# Patient Record
Sex: Female | Born: 1965 | Race: Black or African American | Hispanic: No | Marital: Single | Smoking: Never smoker
Health system: Southern US, Community
[De-identification: ages and names within clinical notes are randomized; demographics above are authoritative.]

## PROBLEM LIST (undated history)

## (undated) HISTORY — PX: ABDOMINAL HYSTERECTOMY: SHX81

---

## 2021-02-09 ENCOUNTER — Emergency Department: Payer: No Typology Code available for payment source

## 2021-02-09 ENCOUNTER — Emergency Department
Admission: EM | Admit: 2021-02-09 | Discharge: 2021-02-09 | Disposition: A | Discharge status: Home / Self Care | Payer: No Typology Code available for payment source | Attending: Emergency Medicine | Admitting: Emergency Medicine

## 2021-02-09 ENCOUNTER — Other Ambulatory Visit: Payer: Self-pay

## 2021-02-09 DIAGNOSIS — M5126 Other intervertebral disc displacement, lumbar region: Secondary | ICD-10-CM | POA: Insufficient documentation

## 2021-02-09 DIAGNOSIS — N3001 Acute cystitis with hematuria: Secondary | ICD-10-CM | POA: Insufficient documentation

## 2021-02-09 DIAGNOSIS — M549 Dorsalgia, unspecified: Secondary | ICD-10-CM | POA: Diagnosis present

## 2021-02-09 DIAGNOSIS — M5136 Other intervertebral disc degeneration, lumbar region: Secondary | ICD-10-CM

## 2021-02-09 LAB — URINALYSIS, COMPLETE (UACMP) WITH MICROSCOPIC
Bilirubin Urine: NEGATIVE
Glucose, UA: NEGATIVE mg/dL
Ketones, ur: NEGATIVE mg/dL
Leukocytes,Ua: NEGATIVE
Nitrite: NEGATIVE
Protein, ur: NEGATIVE mg/dL
Specific Gravity, Urine: 1.021 (ref 1.005–1.030)
pH: 5 (ref 5.0–8.0)

## 2021-02-09 LAB — PREGNANCY, URINE: Preg Test, Ur: NEGATIVE

## 2021-02-09 MED ORDER — PREDNISONE 10 MG PO TABS: ORAL_TABLET | ORAL | 0 refills | Status: AC

## 2021-02-09 MED ORDER — METHOCARBAMOL 750 MG PO TABS: 750.0000 mg | ORAL_TABLET | Freq: Four times a day (QID) | ORAL | 0 refills | Status: AC | PRN

## 2021-02-09 MED ORDER — HYDROCODONE-ACETAMINOPHEN 5-325 MG PO TABS
1.0000 | ORAL_TABLET | Freq: Once | ORAL | Status: AC
Start: 2021-02-09 — End: 2021-02-09
  Administered 2021-02-09: 1 via ORAL
  Filled 2021-02-09: qty 1

## 2021-02-09 MED ORDER — ACETAMINOPHEN 325 MG PO TABS
650.0000 mg | ORAL_TABLET | Freq: Once | ORAL | Status: AC
  Administered 2021-02-09: 650 mg via ORAL
  Filled 2021-02-09: qty 2

## 2021-02-09 MED ORDER — DEXAMETHASONE SODIUM PHOSPHATE 10 MG/ML IJ SOLN
10.0000 mg | Freq: Once | INTRAMUSCULAR | Status: AC
  Administered 2021-02-09: 10 mg via INTRAMUSCULAR
  Filled 2021-02-09: qty 1

## 2021-02-09 MED ORDER — CEPHALEXIN 500 MG PO CAPS
1000.0000 mg | ORAL_CAPSULE | Freq: Once | ORAL | Status: AC
  Administered 2021-02-09: 1000 mg via ORAL
  Filled 2021-02-09: qty 2

## 2021-02-09 MED ORDER — METHOCARBAMOL 500 MG PO TABS
750.0000 mg | ORAL_TABLET | Freq: Once | ORAL | Status: AC
  Administered 2021-02-09: 750 mg via ORAL
  Filled 2021-02-09: qty 2

## 2021-02-09 MED ORDER — CEPHALEXIN 500 MG PO CAPS: 500.0000 mg | ORAL_CAPSULE | Freq: Four times a day (QID) | ORAL | 0 refills | Status: AC

## 2021-02-09 NOTE — ED Triage Notes (Signed)
Reports hx of lower back pain over last year, worse on awakening this AM. Pt states pain with weight wearing and walking. No injury. Pt alert and oriented X4, cooperative, RR even and unlabored, color WNL. Pt in NAD.

## 2021-02-09 NOTE — ED Notes (Signed)
Pt taken to flex 51 via wheelchair, placed in bed by NT. NAD.

## 2021-02-09 NOTE — Discharge Instructions (Addendum)
You have been prescribed an antibiotic for a potential urinary tract infection.  Please take the entire antibiotic and do not stop early.  You have also been prescribed a steroid taper, please take this as prescribed.  You have also been prescribed a muscle relaxer to use up to 4 times daily as needed for pain.  Please do not drive or operate heavy machinery on this medication.  You may also combine these medications with Tylenol, 1000 mg up to 4 times daily as needed for pain.  Please follow-up with neurosurgery if pain persist.  Please return to the emergency department with any worsening, or establish with primary care otherwise for follow-up.

## 2021-02-09 NOTE — ED Provider Notes (Signed)
[facility] Emergency Department Provider Note  ____________________________________________   Event Date/Time   First MD Initiated Contact with Patient 02/09/21 1018     (approximate)  I have reviewed the triage vital signs and the nursing notes.   HISTORY  Chief Complaint Back Pain  HPI Teresa Shelton is a 55 y.o. female reports to the ER for evaluation of back pain that started today.  Patient states intermittent problems with it over the last year, much worse today.  She reports if she sits or lays down, there is a significant amount of pressure in the back, she has initially alleviation of symptoms with standing and weightbearing but then the pain worsens again.  She denies any trauma or injury.  Denies fever, loss of bowel or bladder control.  She denies weakness down either extremity.  She denies any urinary symptoms or flank pain.  No alleviating measures were attempted prior to arrival.       History reviewed. No pertinent past medical history.  There are no problems to display for this patient.   History reviewed. No pertinent surgical history.  Prior to Admission medications   Medication Sig Start Date End Date Taking? Authorizing Provider  cephALEXin (KEFLEX) 500 MG capsule Take 1 capsule (500 mg total) by mouth 4 (four) times daily for 7 days. 02/09/21 02/16/21 Yes Ellarie Picking, Ruben Gottronaitlin J, PA  methocarbamol (ROBAXIN-750) 750 MG tablet Take 1 tablet (750 mg total) by mouth 4 (four) times daily as needed for up to 10 days for muscle spasms. 02/09/21 02/19/21 Yes Myiesha Edgar, Ruben Gottronaitlin J, PA  predniSONE (DELTASONE) 10 MG tablet Take 6 tablets (60 mg total) by mouth daily for 1 day, THEN 5 tablets (50 mg total) daily for 1 day, THEN 4 tablets (40 mg total) daily for 1 day, THEN 3 tablets (30 mg total) daily for 1 day, THEN 2 tablets (20 mg total) daily for 1 day, THEN 1 tablet (10 mg total) daily for 1 day. 02/09/21 02/15/21 Yes Lucy Chrisodgers, Tricha Ruggirello J, PA     Allergies Patient has no known allergies.  No family history on file.  Social History Social History   Tobacco Use  . Smoking status: Never Smoker  Substance Use Topics  . Alcohol use: Not Currently    Review of Systems Constitutional: No fever/chills Eyes: No visual changes. ENT: No sore throat. Cardiovascular: Denies chest pain. Respiratory: Denies shortness of breath. Gastrointestinal: No abdominal pain.  No nausea, no vomiting.  No diarrhea.  No constipation. Genitourinary: Negative for dysuria. Musculoskeletal: + for back pain. Skin: Negative for rash. Neurological: Negative for headaches, focal weakness or numbness.  ____________________________________________   PHYSICAL EXAM:  VITAL SIGNS: ED Triage Vitals  Enc Vitals Group     BP 02/09/21 0921 135/67     Pulse Rate 02/09/21 0921 95     Resp 02/09/21 0921 14     Temp 02/09/21 0921 98 F (36.7 C)     Temp Source 02/09/21 0921 Oral     SpO2 02/09/21 0921 96 %     Weight 02/09/21 0919 240 lb (108.9 kg)     Height 02/09/21 0919 5\' 6"  (1.676 m)     Head Circumference --      Peak Flow --      Pain Score 02/09/21 0919 8     Pain Loc --      Pain Edu? --      Excl. in [place]? --    Constitutional: Alert and oriented. Well appearing and  in no acute distress. Eyes: Conjunctivae are normal. PERRL. EOMI. Head: Atraumatic. Nose: No congestion/rhinnorhea. Mouth/Throat: Mucous membranes are moist.  Oropharynx non-erythematous. Neck: No stridor.   Cardiovascular: Normal rate, regular rhythm. Grossly normal heart sounds.  Good peripheral circulation. Respiratory: Normal respiratory effort.  No retractions. Lungs CTAB. Gastrointestinal: Soft and nontender. No distention. No abdominal bruits.  No CVA tenderness. Musculoskeletal: There is tenderness over the midline of the lumbar spine, minimal paraspinal tenderness also present.  5/5 strength in bilateral lower extremities in ankle plantarflexion, dorsiflexion, knee  flexion extension, hip flexion.  Negative straight leg raise bilaterally. Neurologic:  Normal speech and language. No gross focal neurologic deficits are appreciated. No gait instability. Skin:  Skin is warm, dry and intact. No rash noted. Psychiatric: Mood and affect are normal. Speech and behavior are normal.  ____________________________________________   LABS (all labs ordered are listed, but only abnormal results are displayed)  Labs Reviewed  URINALYSIS, COMPLETE (UACMP) WITH MICROSCOPIC - Abnormal; Notable for the following components:      Result Value   Color, Urine YELLOW (*)    APPearance HAZY (*)    Hgb urine dipstick SMALL (*)    Bacteria, UA RARE (*)    All other components within normal limits  URINE CULTURE  PREGNANCY, URINE   ____________________________________________  RADIOLOGY I, Lucy Chris, personally viewed and evaluated these images (plain radiographs) as part of my medical decision making, as well as reviewing the written report by the radiologist.  ED provider interpretation: X-rays of the lumbar spine demonstrate disc space narrowing in the L5-S1 region, but no acute fracture identified, see radiology report for CT renal stone findings  Official radiology report(s): DG Lumbar Spine 2-3 Views  Result Date: 02/09/2021 CLINICAL DATA:  Low back pain EXAM: LUMBAR SPINE - 2-3 VIEW COMPARISON:  None. FINDINGS: Frontal, lateral, and spot lumbosacral lateral images were obtained. There are 5 non-rib-bearing lumbar type vertebral bodies. There is no fracture or spondylolisthesis. There is moderate disc space narrowing at L5-S1. Other disc spaces appear unremarkable. No erosive change. IMPRESSION: Disc space narrowing at L5-S1. Other disc spaces appear unremarkable. No fracture or spondylolisthesis. Electronically Signed   By: Bretta Bang III M.D.   On: 02/09/2021 11:12   CT Renal Stone Study  Result Date: 02/09/2021 CLINICAL DATA:  Pain and hematuria  EXAM: CT ABDOMEN AND PELVIS WITHOUT CONTRAST TECHNIQUE: Multidetector CT imaging of the abdomen and pelvis was performed following the standard protocol without oral or IV contrast. COMPARISON:  None. FINDINGS: Lower chest: Lung bases clear. Hepatobiliary: A small portion of the dome of the liver is not imaged. Visualized liver appears unremarkable on this noncontrast enhanced study without focal evident lesion gallbladder wall is not appreciably thickened. There is no biliary duct dilatation. Pancreas: There is no pancreatic mass or inflammatory focus. Spleen: No splenic lesions are evident. Adrenals/Urinary Tract: Right adrenal appears normal. There is an apparent adenoma in the left adrenal measuring 1.5 x 1.1 cm. There is no appreciable renal mass or hydronephrosis on either side. There is no renal or ureteral calculus on either side. Note that there is a 2 mm calcification immediately adjacent to the distal right ureter but appearing separate from the right ureter, best evident on coronal images. Urinary bladder is midline. Urinary bladder wall borderline thickened. Stomach/Bowel: There are scattered colonic diverticula throughout the colon without diverticulitis. No appreciable bowel wall or mesenteric thickening. The terminal ileum appears normal. Appendix appears unremarkable. No appreciable free air or portal venous air.  Vascular/Lymphatic: No abdominal aortic aneurysm. No vascular lesions are evident on this noncontrast enhanced study. There is a lymph node in the left inguinal region measuring 1.4 x 1.1 cm. There are several nearby subcentimeter inguinal lymph nodes. There are lymph nodes in the pelvis which are toward upper limits of normal including a lymph node in the left common femoral node chain with a short axis diameter of 9 mm, upper normal. Several subcentimeter mesenteric lymph nodes also noted. Reproductive: Uterus absent.  No adnexal masses are evident. Other: No abscess or ascites evident in  the abdomen or pelvis. There is slight fat in the umbilicus. Musculoskeletal: There is degenerative change in the lower lumbar spine. There is a degree of spinal stenosis at L4-5 due to bony hypertrophy and disc protrusion. No blastic or lytic bone lesions. No intramuscular lesions evident. IMPRESSION: 1. Slight thickening of the urinary bladder wall. Advise urinalysis to assess for potential infection/cystitis. 2.  No renal or ureteral calculi.  No hydronephrosis on either side. 3. Scattered colonic diverticula. No diverticulitis. No bowel wall thickening or bowel obstruction. No abscess in the abdomen or pelvis. Appendix appears normal. 4. Slightly prominent left inguinal lymph node of uncertain etiology. Several upper normal in size pelvic lymph nodes and inguinal lymph nodes of uncertain etiology. Question reactive type etiology. 5.  Benign left adrenal adenoma measuring 1.5 x 1.1 cm. 6. Spinal stenosis at L4-5 due to disc protrusion and bony hypertrophy. 7.  Uterus absent. Electronically Signed   By: Bretta Bang III M.D.   On: 02/09/2021 12:24    ____________________________________________   INITIAL IMPRESSION / ASSESSMENT AND PLAN / ED COURSE  As part of my medical decision making, I reviewed the following data within the electronic MEDICAL RECORD NUMBER Nursing notes reviewed and incorporated, Labs reviewed, Radiograph reviewed, Notes from prior ED visits and [place] Controlled Substance Database        Patient is a 55 year old female who presents to the emergency department for evaluation of acute on chronic low back pain that began today.  She denies any fever, loss of bowel or bladder control or weakness on the extremities, denies dysuria or flank pain.  See HPI for further details.  In triage, patient is afebrile, normotensive and has signs.  Physical exam, patient does have significant tenderness to the midline of the lumbar spine with minimal paraspinal tenderness.  She does appear to be in  quite a bit of pain.  Urinalysis was obtained and demonstrates small amount of hemoglobin and rare bacteria, but no leukocytes or nitrates.  X-ray was obtained of the lumbar spine and demonstrates some disc space narrowing at L5-S1.  Given the patient's hematuria as well as significance of her back pain, CT renal stone study was obtained.  While this is negative for any acute stone, there is is better visualization of the lumbar spine with disc protrusion at L5-S1.  There is also bladder wall thickening that radiology recommended to be correlated with urinalysis.  While she does have rare bacteria, no leukocytes or nitrates, will culture the urine and initiate course of antibiotics for this.  In the interim, we will also initiate a course of steroids with muscle relaxant for musculoskeletal related back pain.  Recommend the patient have close follow-up with primary care after trial of these medications.  She is amenable with plan, stable this time for outpatient follow-up.      ____________________________________________   FINAL CLINICAL IMPRESSION(S) / ED DIAGNOSES  Final diagnoses:  Bulging lumbar disc  Acute cystitis with hematuria     ED Discharge Orders         Ordered    cephALEXin (KEFLEX) 500 MG capsule  4 times daily        02/09/21 1257    predniSONE (DELTASONE) 10 MG tablet        02/09/21 1257    methocarbamol (ROBAXIN-750) 750 MG tablet  4 times daily PRN        02/09/21 1257          *Please note:  Nicha Hemann was evaluated in Emergency Department on 02/10/2021 for the symptoms described in the history of present illness. She was evaluated in the context of the global COVID-19 pandemic, which necessitated consideration that the patient might be at risk for infection with the SARS-CoV-2 virus that causes COVID-19. Institutional protocols and algorithms that pertain to the evaluation of patients at risk for COVID-19 are in a state of rapid change based on information released  by regulatory bodies including the CDC and federal and state organizations. These policies and algorithms were followed during the patient's care in the ED.  Some ED evaluations and interventions may be delayed as a result of limited staffing during and the pandemic.*   Note:  This document was prepared using Dragon voice recognition software and may include unintentional dictation errors.   Lucy Chris, PA 02/10/21 8850    Concha Se, MD 02/10/21 0830

## 2021-02-09 NOTE — ED Notes (Signed)
D/c instructions reviewed with pt. Med hold until 1330.

## 2021-02-10 LAB — URINE CULTURE

## 2022-04-16 IMAGING — CT CT RENAL STONE PROTOCOL
2 of 4 series · 16 of 46 positions shown, 18 images · non-contrast
Comparison: None.

CLINICAL DATA: Pain and hematuria

EXAM:
CT ABDOMEN AND PELVIS WITHOUT CONTRAST
TECHNIQUE: Multidetector CT imaging of the abdomen and pelvis was performed
following the standard protocol without oral or IV contrast.

[Series 2: stone full standard · axial · 0.84mm/px · z∈[-491,-91]mm · 13 of 88 slices shown, 15 images]
[im 4/88  soft-tissue]
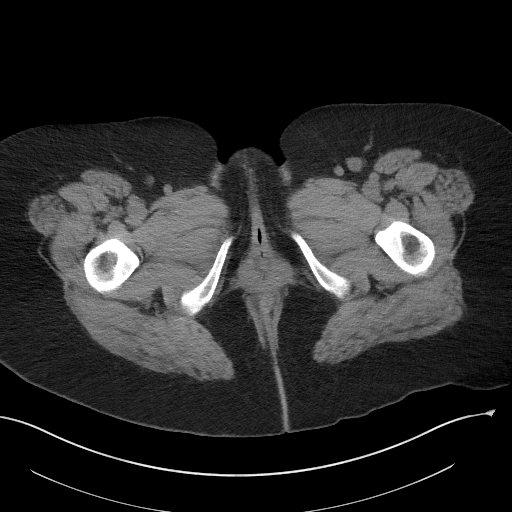
[im 4/88  bone]
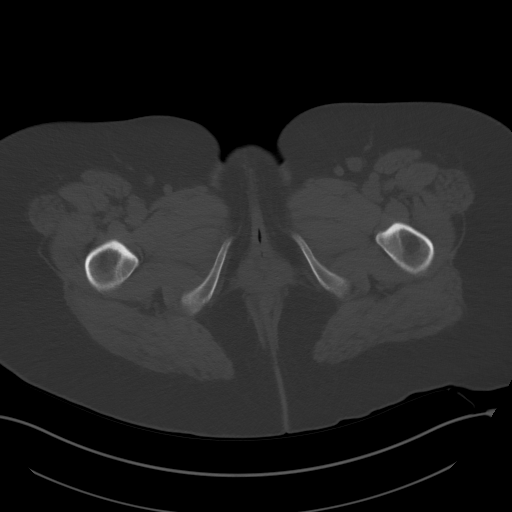
[im 12/88  soft-tissue]
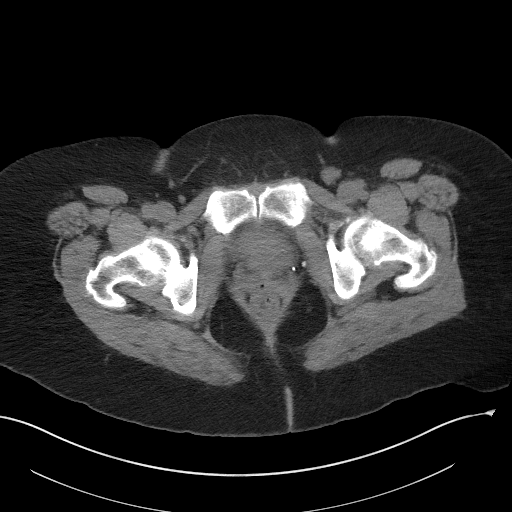
[im 19/88  soft-tissue]
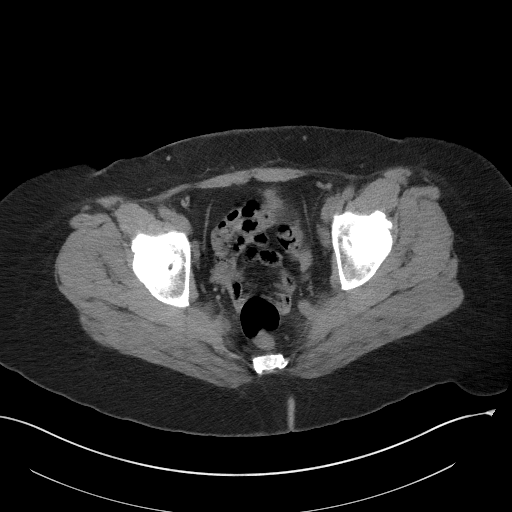
[im 23/88  soft-tissue]
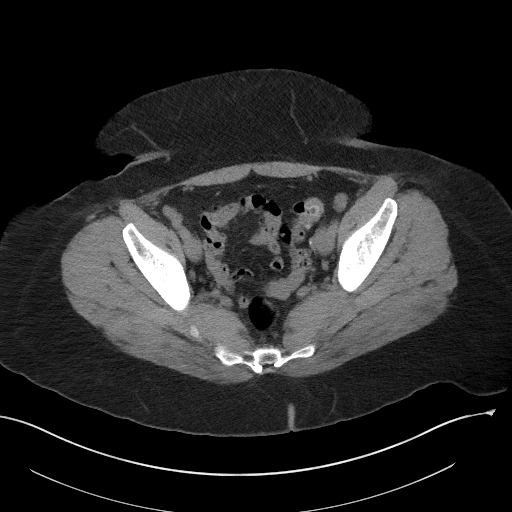
[im 31/88  soft-tissue]
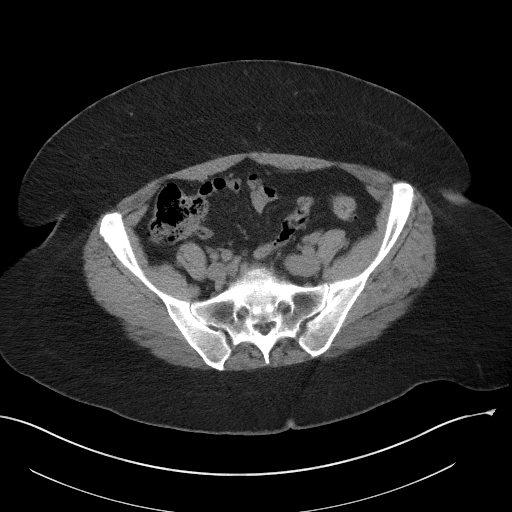
[im 38/88  soft-tissue]
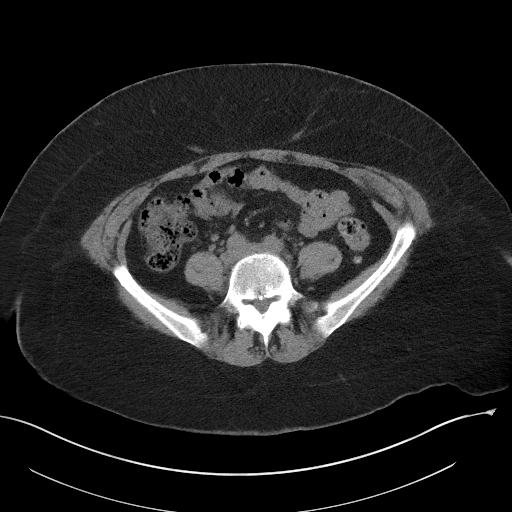
[im 46/88  soft-tissue]
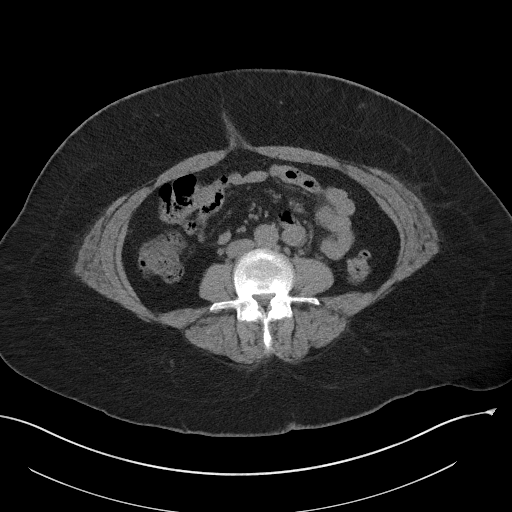
[im 50/88  soft-tissue]
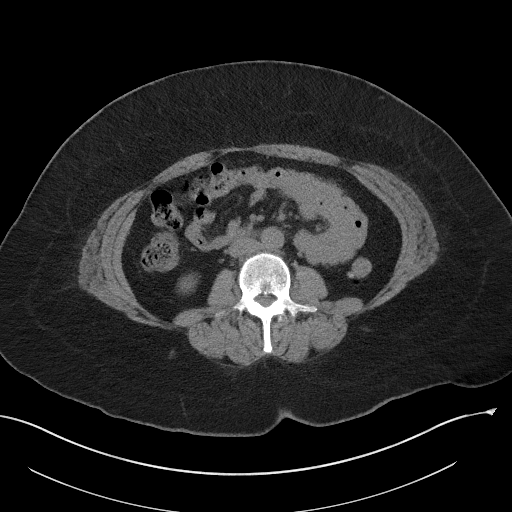
[im 57/88  soft-tissue]
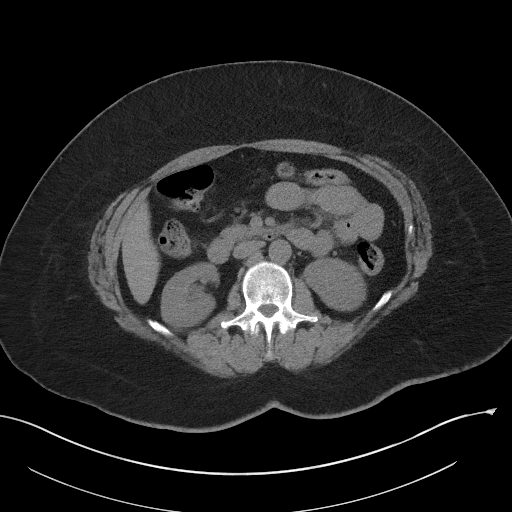
[im 57/88  bone]
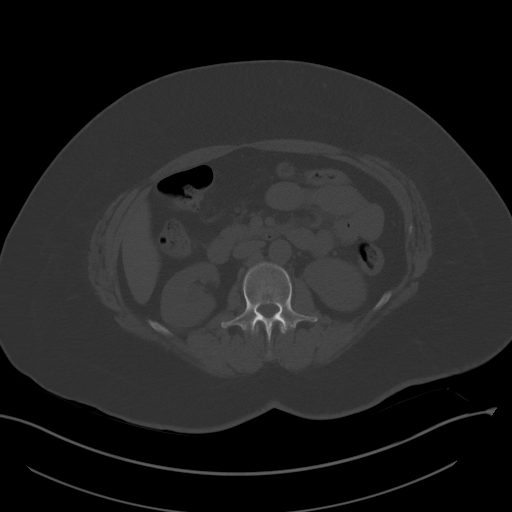
[im 65/88  soft-tissue]
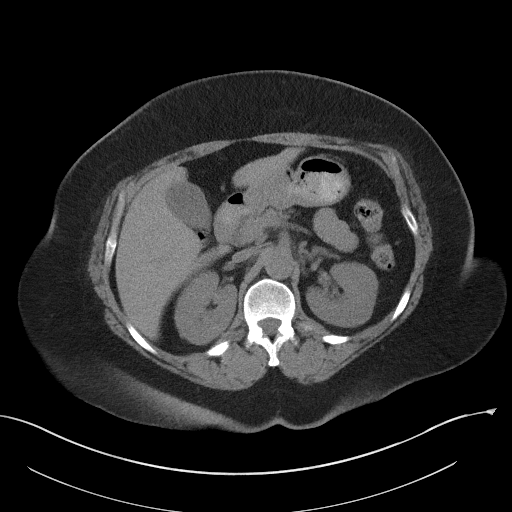
[im 69/88  soft-tissue]
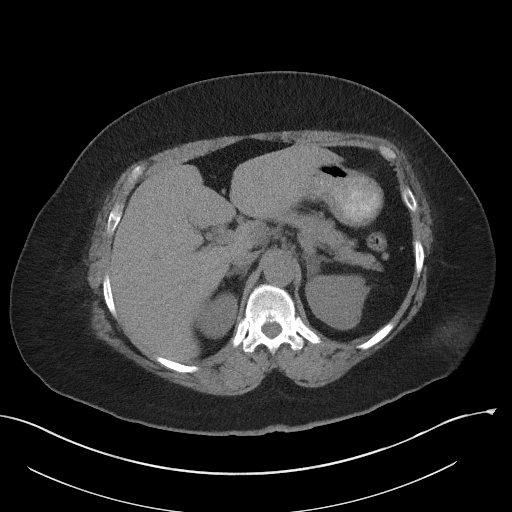
[im 76/88  soft-tissue]
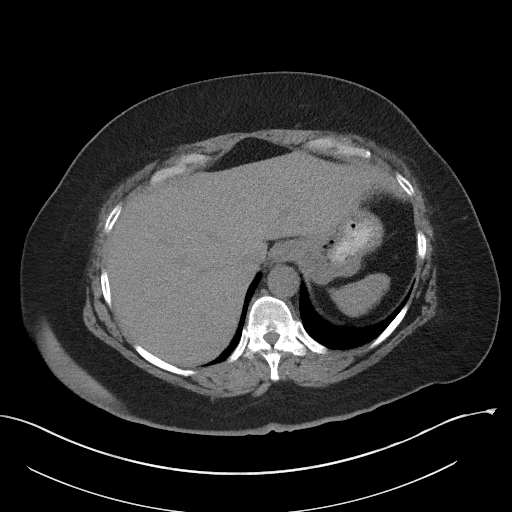
[im 84/88  soft-tissue]
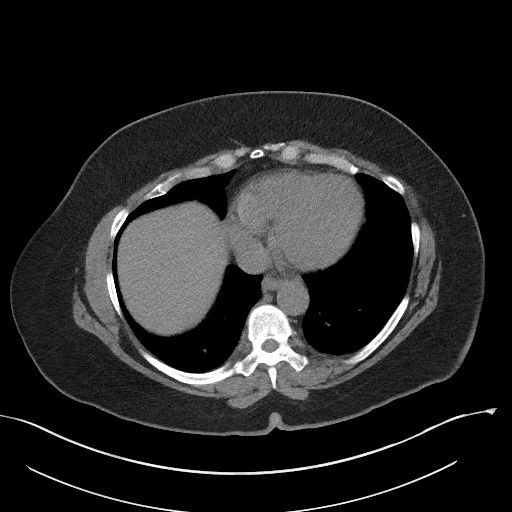

[Series 5: coronal · coronal · 0.88mm/px · 3 of 151 slices shown]
[im 51/151  soft-tissue]
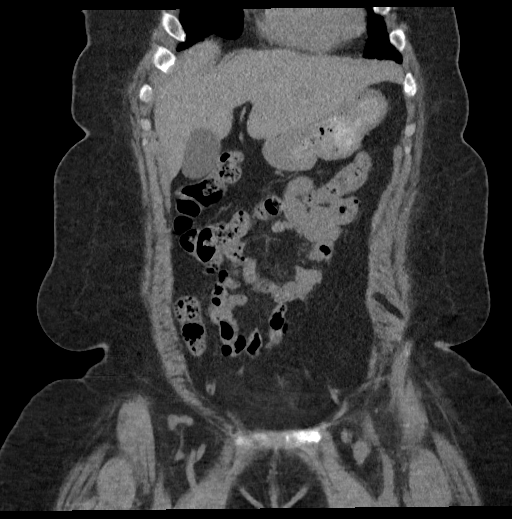
[im 67/151  soft-tissue]
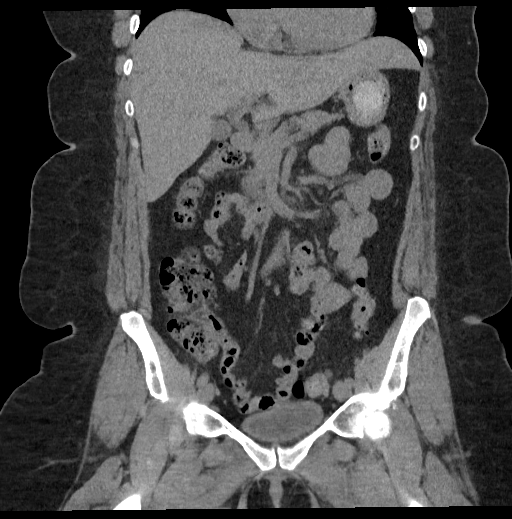
[im 84/151  soft-tissue]
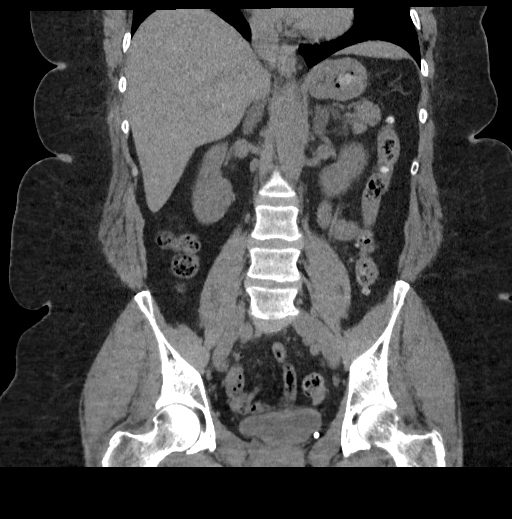

[16 of 46 positions shown; findings below may reference images not displayed]

FINDINGS: Lower chest: Lung bases clear.

Hepatobiliary: A small portion of the dome of the liver is not
imaged. Visualized liver appears unremarkable on this noncontrast
enhanced study without focal evident lesion gallbladder wall is not
appreciably thickened. There is no biliary duct dilatation.

Pancreas: There is no pancreatic mass or inflammatory focus.

Spleen: No splenic lesions are evident.

Adrenals/Urinary Tract: Right adrenal appears normal. There is an
apparent adenoma in the left adrenal measuring 1.5 x 1.1 cm. There
is no appreciable renal mass or hydronephrosis on either side. There
is no renal or ureteral calculus on either side. Note that there is
a 2 mm calcification immediately adjacent to the distal right ureter
but appearing separate from the right ureter, best evident on
coronal images. Urinary bladder is midline. Urinary bladder wall
borderline thickened.

Stomach/Bowel: There are scattered colonic diverticula throughout
the colon without diverticulitis. No appreciable bowel wall or
mesenteric thickening. The terminal ileum appears normal. Appendix
appears unremarkable. No appreciable free air or portal venous air.

Vascular/Lymphatic: No abdominal aortic aneurysm. No vascular
lesions are evident on this noncontrast enhanced study. There is a
lymph node in the left inguinal region measuring 1.4 x 1.1 cm. There
are several nearby subcentimeter inguinal lymph nodes. There are
lymph nodes in the pelvis which are toward upper limits of normal
including a lymph node in the left common femoral node chain with a
short axis diameter of 9 mm, upper normal. Several subcentimeter
mesenteric lymph nodes also noted.

Reproductive: Uterus absent.  No adnexal masses are evident.

Other: No abscess or ascites evident in the abdomen or pelvis. There
is slight fat in the umbilicus.

Musculoskeletal: There is degenerative change in the lower lumbar
spine. There is a degree of spinal stenosis at L4-5 due to bony
hypertrophy and disc protrusion. No blastic or lytic bone lesions.
No intramuscular lesions evident.
IMPRESSION: 1. Slight thickening of the urinary bladder wall. Advise urinalysis
to assess for potential infection/cystitis.

2.  No renal or ureteral calculi.  No hydronephrosis on either side.

3. Scattered colonic diverticula. No diverticulitis. No bowel wall
thickening or bowel obstruction. No abscess in the abdomen or
pelvis. Appendix appears normal.

4. Slightly prominent left inguinal lymph node of uncertain
etiology. Several upper normal in size pelvic lymph nodes and
inguinal lymph nodes of uncertain etiology. Question reactive type
etiology.

5.  Benign left adrenal adenoma measuring 1.5 x 1.1 cm.

6. Spinal stenosis at L4-5 due to disc protrusion and bony
hypertrophy.

7.  Uterus absent.

## 2023-01-31 ENCOUNTER — Ambulatory Visit
Admission: RE | Admit: 2023-01-31 | Discharge: 2023-01-31 | Disposition: A | Discharge status: Home / Self Care | Payer: No Typology Code available for payment source | Source: Ambulatory Visit

## 2023-01-31 DIAGNOSIS — K036 Deposits [accretions] on teeth: Secondary | ICD-10-CM | POA: Insufficient documentation

## 2023-01-31 DIAGNOSIS — K05311 Chronic periodontitis, localized, slight: Secondary | ICD-10-CM | POA: Insufficient documentation

## 2023-01-31 DIAGNOSIS — M25549 Pain in joints of unspecified hand: Secondary | ICD-10-CM | POA: Insufficient documentation

## 2023-01-31 DIAGNOSIS — E669 Obesity, unspecified: Secondary | ICD-10-CM | POA: Insufficient documentation

## 2023-01-31 DIAGNOSIS — Z6841 Body Mass Index (BMI) 40.0 and over, adult: Secondary | ICD-10-CM | POA: Insufficient documentation

## 2023-01-31 DIAGNOSIS — R059 Cough, unspecified: Secondary | ICD-10-CM | POA: Insufficient documentation

## 2023-01-31 DIAGNOSIS — E559 Vitamin D deficiency, unspecified: Secondary | ICD-10-CM | POA: Insufficient documentation

## 2023-01-31 DIAGNOSIS — R030 Elevated blood-pressure reading, without diagnosis of hypertension: Secondary | ICD-10-CM | POA: Insufficient documentation

## 2023-01-31 DIAGNOSIS — M722 Plantar fascial fibromatosis: Secondary | ICD-10-CM | POA: Insufficient documentation

## 2023-01-31 DIAGNOSIS — F411 Generalized anxiety disorder: Secondary | ICD-10-CM | POA: Insufficient documentation

## 2023-01-31 DIAGNOSIS — Z23 Encounter for immunization: Secondary | ICD-10-CM | POA: Insufficient documentation

## 2023-01-31 DIAGNOSIS — M545 Low back pain, unspecified: Secondary | ICD-10-CM

## 2023-01-31 DIAGNOSIS — Z655 Exposure to disaster, war and other hostilities: Secondary | ICD-10-CM | POA: Insufficient documentation

## 2023-01-31 DIAGNOSIS — F17200 Nicotine dependence, unspecified, uncomplicated: Secondary | ICD-10-CM | POA: Insufficient documentation

## 2023-01-31 DIAGNOSIS — H5200 Hypermetropia, unspecified eye: Secondary | ICD-10-CM | POA: Insufficient documentation

## 2023-01-31 DIAGNOSIS — Z90710 Acquired absence of both cervix and uterus: Secondary | ICD-10-CM | POA: Insufficient documentation

## 2023-01-31 DIAGNOSIS — D649 Anemia, unspecified: Secondary | ICD-10-CM | POA: Insufficient documentation

## 2023-01-31 DIAGNOSIS — Z780 Asymptomatic menopausal state: Secondary | ICD-10-CM | POA: Insufficient documentation

## 2023-01-31 DIAGNOSIS — F1099 Alcohol use, unspecified with unspecified alcohol-induced disorder: Secondary | ICD-10-CM | POA: Insufficient documentation

## 2023-01-31 DIAGNOSIS — Z7689 Persons encountering health services in other specified circumstances: Secondary | ICD-10-CM | POA: Insufficient documentation

## 2023-01-31 DIAGNOSIS — N959 Unspecified menopausal and perimenopausal disorder: Secondary | ICD-10-CM | POA: Insufficient documentation

## 2023-01-31 DIAGNOSIS — S058X9A Other injuries of unspecified eye and orbit, initial encounter: Secondary | ICD-10-CM | POA: Insufficient documentation

## 2023-01-31 DIAGNOSIS — M25569 Pain in unspecified knee: Secondary | ICD-10-CM | POA: Insufficient documentation

## 2023-01-31 DIAGNOSIS — F320 Major depressive disorder, single episode, mild: Secondary | ICD-10-CM | POA: Insufficient documentation

## 2023-01-31 DIAGNOSIS — N951 Menopausal and female climacteric states: Secondary | ICD-10-CM | POA: Insufficient documentation

## 2023-01-31 DIAGNOSIS — G4733 Obstructive sleep apnea (adult) (pediatric): Secondary | ICD-10-CM | POA: Insufficient documentation

## 2023-01-31 MED ORDER — PREDNISONE 20 MG PO TABS: ORAL_TABLET | ORAL | 0 refills | Status: AC

## 2023-01-31 MED ORDER — KETOROLAC TROMETHAMINE 30 MG/ML IJ SOLN
30.0000 mg | Freq: Once | INTRAMUSCULAR | Status: AC
  Administered 2023-01-31: 30 mg via INTRAMUSCULAR

## 2023-01-31 MED ORDER — CYCLOBENZAPRINE HCL 10 MG PO TABS: 10.0000 mg | ORAL_TABLET | Freq: Two times a day (BID) | ORAL | 0 refills | Status: AC | PRN

## 2023-01-31 NOTE — ED Triage Notes (Signed)
Patient presents to UC for left sided back pain x 1 week. Taking motrin, tylenol, and muscle relaxer. Pain with movement.

## 2023-01-31 NOTE — Discharge Instructions (Addendum)
Recommend evaluation by physical medicine or orthopedic provider who manages back pain.

## 2023-01-31 NOTE — ED Provider Notes (Signed)
Roderic Palau    CSN: [id number] Arrival date & time: 01/31/23  1144      History   Chief Complaint Chief Complaint  Patient presents with   Back Pain    Entered by patient    HPI Teresa Shelton is a 57 y.o. female.    Back Pain   Presents To urgent care with 1 week history of left-sided back pain.  Denies sciatica.  Denies radiculopathy or saddle paresthesias.  Patient has history of lower back pain with episode reported in 2020 where she was evaluated in the ED with imaging suggestive of bulging disc.  She was treated at that time with a course of steroids as well as a muscle relaxant.  She states that symptoms improved with this treatment.  She has used a muscle relaxant for her current back pain and states that her symptoms have not improved.  The muscle relaxant does help with sleep.  History reviewed. No pertinent past medical history.  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 No pertinent surgical history.  OB History   No obstetric history on file.      Home Medications    Prior to Admission medications   Medication Sig Start Date End Date Taking? Authorizing Provider  estradiol (VIVELLE-DOT) 0.075 MG/24HR Place onto the skin. 10/24/22  Yes [provider]  predniSONE (DELTASONE) 20 MG tablet Take 3 tablets (60 mg total) by mouth daily with breakfast for 2 days, THEN 2 tablets (40 mg total) daily with breakfast for 2 days, THEN 1 tablet (20 mg total) daily with breakfast for 2 days. 01/31/23 02/05/23 Yes Lasalle Abee, Annie Main, FNP  venlafaxine (EFFEXOR) 25 MG tablet Take by mouth.    [provider]    Family History History reviewed. No pertinent family history.  Social History Social History   Tobacco Use   Smoking status: Never   Smokeless tobacco: Never  Vaping Use   Vaping Use: Never used  Substance Use Topics   Alcohol use: Not Currently   Drug use: Never     Allergies   Other and Orlistat   Review of Systems Review of Systems  Musculoskeletal:  Positive for back pain.     Physical Exam Triage Vital Signs ED Triage Vitals [01/31/23 1231]  Enc Vitals Group     BP      Pulse      Resp      Temp  Temp src      SpO2      Weight      Height      Head Circumference      Peak Flow      Pain Score 0     Pain Loc      Pain Edu?      Excl. in [place]?    No data found.  Updated Vital Signs There were no vitals taken for this visit.  Visual Acuity Right Eye Distance:   Left Eye Distance:   Bilateral Distance:    Right Eye Near:   Left Eye Near:    Bilateral Near:     Physical Exam Vitals reviewed.  Constitutional:      General: She is in acute distress.     Appearance: Normal appearance.  Skin:    General: Skin is warm and dry.  Neurological:      General: No focal deficit present.     Mental Status: She is alert and oriented to person, place, and time.  Psychiatric:        Mood and Affect: Mood normal.        Behavior: Behavior normal.      UC Treatments / Results  Labs (all labs ordered are listed, but only abnormal results are displayed) Labs Reviewed - No data to display  EKG   Radiology No results found.  Procedures Procedures (including critical care time)  Medications Ordered in UC Medications  ketorolac (TORADOL) 30 MG/ML injection 30 mg (has no administration in time range)  ketorolac (TORADOL) 30 MG/ML injection 30 mg (has no administration in time range)    Initial Impression / Assessment and Plan / UC Course  I have reviewed the triage vital signs and the nursing notes.  Pertinent labs & imaging results that were available during my care of the patient were reviewed by me and considered in my medical decision making (see chart for details).   Patient does not appear to be in significant discomfort while seated in the exam room.  Limited exam was performed given the extent of her discomfort and the limited benefit to the treatment plan.  Given her history based on her evaluation 2 years ago with imaging, will treat again with a course of steroids and muscle relaxant and recommend patient be evaluated by a back specialist.  Patient acknowledges understanding and agreement with this treatment plan.   Final Clinical Impressions(s) / UC Diagnoses   Final diagnoses:  Acute left-sided low back pain without sciatica     Discharge Instructions      Recommend evaluation by physical medicine or orthopedic provider who manages back pain.   ED Prescriptions     Medication Sig Dispense Auth. Provider   predniSONE (DELTASONE) 20 MG tablet Take 3 tablets (60 mg total) by mouth daily with breakfast for 2 days, THEN 2 tablets (40 mg total) daily with breakfast for 2 days, THEN 1 tablet (20 mg total) daily with  breakfast for 2 days. 12 tablet Jerilyn Gillaspie, FNP      PDMP not reviewed this encounter.   Rose Phi, [place] 01/31/23 1302

## 2023-01-31 NOTE — Medical Student Note (Signed)
[facility] Systems analyst Note For educational purposes for Medical, PA and NP students only and not part of the legal medical record.   CSN: [id number] Arrival date & time: 01/31/23  1144      History   Chief Complaint Chief Complaint  Patient presents with   Back Pain    Entered by patient    HPI Teresa Shelton is a 57 y.o. female.  Patient presents with left lower back pain x1 day. She does not recall any specific event that preceded it, but did say that she had been bending over helping clean her mother's walker wheels and has been working in the yard some. This specific pain has never happened before. She does have a history (2 years ago) of lower back pain related to DDD/bulging disc. There was concern for a kidney stone at that time that was ruled out; no hematuria at this time. She has been taking tylenol, motrin, and methocarbamol with little effect other than it allows her to sleep.   The history is provided by the patient.  Back Pain Location:  Gluteal region Quality:  Stabbing Radiates to:  Does not radiate Pain severity:  Severe Pain is:  Same all the time Onset quality:  Sudden Duration:  1 day Timing:  Intermittent Progression:  Waxing and waning Chronicity:  New Context: not falling, not jumping from heights, not lifting heavy objects, not MVA, not occupational injury, not pedestrian accident, not physical stress, not recent illness, not recent injury and not twisting   Relieved by:  Being still Worsened by:  Movement and bending Ineffective treatments:  Muscle relaxants and ibuprofen Associated symptoms: no bladder incontinence, no leg pain, no numbness, no paresthesias, no tingling and no weakness   Risk factors: obesity   Risk factors: no recent surgery and no steroid use     History reviewed. No pertinent past medical history.  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[place]) 01/31/2023   Anxiety state 01/31/2023   Cough, unspecified 01/31/2023   Deposits (accretions) on teeth 01/31/2023   Chronic periodontitis, localized, slight 01/31/2023   Elevated blood-pressure reading without diagnosis of hypertension 01/31/2023   Exposure to combat 01/31/2023   Encounter for immunization 01/31/2023   Hypermetropia 01/31/2023   Major depressive disorder, single episode, mild ([place]) 01/31/2023   Menopausal and female climacteric states 01/31/2023   Menopause 01/31/2023   Menopausal problem 01/31/2023   Body mass index (BMI) 40.0-44.9, adult ([place]) 01/31/2023   Obesity, unspecified 01/31/2023   Obstructive sleep apnea (adult) (pediatric) 01/31/2023   Plantar fasciitis 01/31/2023   Persons encountering health services in other specified circumstances 01/31/2023   Pain in joint, lower leg 01/31/2023   Pain in joint, hand 01/31/2023   Vitamin D deficiency 01/31/2023   Tobacco use disorder 01/31/2023   Superficial injury of cornea 01/31/2023    History reviewed. No pertinent surgical history.  OB History   No obstetric history on file.      Home Medications    Prior to Admission medications   Medication Sig Start Date End Date Taking? Authorizing Provider  estradiol (VIVELLE-DOT) 0.075 MG/24HR Place onto the skin. 10/24/22  Yes [provider]  venlafaxine (EFFEXOR) 25 MG tablet Take by mouth.    [provider]    Family History History reviewed. No pertinent family history.  Social History Social History  Tobacco Use   Smoking status: Never   Smokeless tobacco: Never  Vaping Use   Vaping Use: Never used  Substance Use Topics   Alcohol use: Not Currently   Drug use: Never     Allergies   Other and Orlistat   Review of Systems Review of Systems  Genitourinary:  Negative for bladder incontinence.  Musculoskeletal:  Positive for back pain.   Neurological:  Negative for tingling, weakness, numbness and paresthesias.  All other systems reviewed and are negative.    Physical Exam Updated Vital Signs There were no vitals taken for this visit.  Physical Exam Vitals and nursing note reviewed.  Constitutional:      Appearance: Normal appearance. She is obese.  HENT:     Head: Normocephalic.  Musculoskeletal:     Lumbar back: Spasms and tenderness present. No swelling, edema, deformity, signs of trauma, lacerations or bony tenderness. Decreased range of motion. No scoliosis.       Back:  Neurological:     Mental Status: She is alert.      ED Treatments / Results  Labs (all labs ordered are listed, but only abnormal results are displayed) Labs Reviewed - No data to display  EKG  Radiology No results found.  Procedures Procedures (including critical care time)  Medications Ordered in ED Medications - No data to display   Initial Impression / Assessment and Plan / ED Course  I have reviewed the triage vital signs and the nursing notes.  Pertinent labs & imaging results that were available during my care of the patient were reviewed by me and considered in my medical decision making (see chart for details).   Given acute onset of pain and exquisite tenderness over muscle, suspect this is muscular in origin and will rx prednisone as this has helped her back pain in the past. She reports methocarbamol has been ineffective for pain relief but has helped her sleep. Will rx Flexeril to see if this is any more effective. Will administer IM toradol in-office today for pain control. Recommended if pain continues or recurs to be evaluated by orthopedist due to known DDD. Patient verbalizes understanding and agrees with plan of care.     Final Clinical Impressions(s) / ED Diagnoses   Final diagnoses:  None    New Prescriptions New Prescriptions   No medications on file

## 2024-05-20 ENCOUNTER — Ambulatory Visit
Admission: EM | Admit: 2024-05-20 | Discharge: 2024-05-20 | Disposition: A | Discharge status: Home / Self Care | Attending: Emergency Medicine | Admitting: Emergency Medicine

## 2024-05-20 DIAGNOSIS — Z20822 Contact with and (suspected) exposure to covid-19: Secondary | ICD-10-CM

## 2024-05-20 LAB — POC SARS CORONAVIRUS 2 AG -  ED: SARS Coronavirus 2 Ag: NEGATIVE

## 2024-05-20 NOTE — Discharge Instructions (Addendum)
 Your COVID test is negative.    Follow-up with your primary care provider as needed.

## 2024-05-20 NOTE — ED Provider Notes (Signed)
 Teresa Shelton    CSN: [id number] Arrival date & time: 05/20/24  1730      History   Chief Complaint Chief Complaint  Patient presents with   Covid Exposure    HPI Teresa Shelton is a 58 y.o. female.  Patient presents with request for a COVID test.  She reports exposure to someone at work with COVID yesterday.  She denies fever, chills, cough, shortness of breath, or other symptoms.  No OTC medications taken today.  Patient is concerned because her mother has lung disease.  She requests a COVID test.  The history is provided by the patient and medical records.    History reviewed. No pertinent past medical history.  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[facility]) 01/31/2023   Anxiety state 01/31/2023   Cough, unspecified 01/31/2023   Deposits (accretions) on teeth 01/31/2023   Chronic periodontitis, localized, slight 01/31/2023   Elevated blood-pressure reading without diagnosis of hypertension 01/31/2023   Exposure to combat 01/31/2023   Encounter for immunization 01/31/2023   Hypermetropia 01/31/2023   Major depressive disorder, single episode, mild ([facility]) 01/31/2023   Menopausal and female climacteric states 01/31/2023   Menopause 01/31/2023   Menopausal problem 01/31/2023   Body mass index (BMI) 40.0-44.9, adult ([facility]) 01/31/2023   Obesity, unspecified 01/31/2023   Obstructive sleep apnea (adult) (pediatric) 01/31/2023   Plantar fasciitis 01/31/2023   Persons encountering health services in other specified circumstances 01/31/2023   Pain in joint, lower leg 01/31/2023   Pain in joint, hand 01/31/2023   Vitamin D deficiency 01/31/2023   Tobacco use disorder 01/31/2023   Superficial injury of cornea 01/31/2023    Past Surgical History:  Procedure Laterality Date   ABDOMINAL HYSTERECTOMY      OB History   No obstetric  history on file.      Home Medications    Prior to Admission medications   Medication Sig Start Date End Date Taking? Authorizing Provider  cyclobenzaprine  (FLEXERIL ) 10 MG tablet Take 1 tablet (10 mg total) by mouth 2 (two) times daily as needed for muscle spasms. Medication is sedating. Do not drive or operate machinery while using this medication. Patient not taking: Reported on 05/20/2024 01/31/23   Immordino, Garnette, FNP  estradiol (VIVELLE-DOT) 0.075 MG/24HR Place onto the skin. 10/24/22   [provider]  venlafaxine (EFFEXOR) 25 MG tablet Take by mouth.    [provider]    Family History History reviewed. No pertinent family history.  Social History Social History   Tobacco Use   Smoking status: Never   Smokeless tobacco: Never  Vaping Use   Vaping status: Never Used  Substance Use Topics   Alcohol use: Not Currently   Drug use: Never     Allergies   Other and Orlistat   Review of Systems Review of Systems  Constitutional:  Negative for chills and fever.  HENT:  Negative for congestion, ear pain and sore throat.   Respiratory:  Negative for cough and shortness of breath.      Physical Exam Triage Vital Signs ED Triage Vitals [05/20/24 1805]  Encounter Vitals Group     BP      Girls Systolic BP Percentile      Girls Diastolic BP Percentile      Boys Systolic BP Percentile      Boys Diastolic BP Percentile  Pulse      Resp      Temp      Temp src      SpO2      Weight      Height      Head Circumference      Peak Flow      Pain Score 0     Pain Loc      Pain Education      Exclude from Growth Chart    No data found.  Updated Vital Signs BP 127/87   Pulse 66   Temp 98.7 F (37.1 C)   Resp 18   SpO2 97%   Visual Acuity Right Eye Distance:   Left Eye Distance:   Bilateral Distance:    Right Eye Near:   Left Eye Near:    Bilateral Near:     Physical Exam Constitutional:      General: She is not in acute  distress. HENT:     Right Ear: Tympanic membrane normal.     Left Ear: Tympanic membrane normal.     Nose: Nose normal.     Mouth/Throat:     Mouth: Mucous membranes are moist.     Pharynx: Oropharynx is clear.  Cardiovascular:     Rate and Rhythm: Normal rate and regular rhythm.     Heart sounds: Normal heart sounds.  Pulmonary:     Effort: Pulmonary effort is normal. No respiratory distress.     Breath sounds: Normal breath sounds.  Neurological:     Mental Status: She is alert.      UC Treatments / Results  Labs (all labs ordered are listed, but only abnormal results are displayed) Labs Reviewed  POC SARS CORONAVIRUS 2 AG -  ED    EKG   Radiology No results found.  Procedures Procedures (including critical care time)  Medications Ordered in UC Medications - No data to display  Initial Impression / Assessment and Plan / UC Course  I have reviewed the triage vital signs and the nursing notes.  Pertinent labs & imaging results that were available during my care of the patient were reviewed by me and considered in my medical decision making (see chart for details).    Exposure to COVID.  Patient was exposed to a coworker with COVID.  She is currently asymptomatic.  She is concerned because her mother has lung disease and she does not want to get her sick.  Rapid COVID-negative.  Discussed with patient that it may be too soon since the exposure was yesterday.  Suggested continuing to monitor for COVID at home over the next week.  Education provided on COVID-19.  Instructed patient to follow-up with her PCP as needed.  She agrees to plan of care.  Final Clinical Impressions(s) / UC Diagnoses   Final diagnoses:  Exposure to COVID-19 virus     Discharge Instructions      Your COVID test is negative.    Follow-up with your primary care provider as needed.     ED Prescriptions   None    PDMP not reviewed this encounter.   Corlis Burnard DEL, NP 05/20/24  1840

## 2024-05-20 NOTE — ED Triage Notes (Signed)
 Patient to Urgent Care requesting Covid testing after potential Covid exposure yesterday.   Denies any current symptoms.
